# Patient Record
Sex: Female | Born: 1975 | Race: Black or African American | Hispanic: No | Marital: Married | Smoking: Never smoker
Health system: Southern US, Community
[De-identification: ages and names within clinical notes are randomized; demographics above are authoritative.]

---

## 2020-09-13 ENCOUNTER — Encounter (HOSPITAL_BASED_OUTPATIENT_CLINIC_OR_DEPARTMENT_OTHER): Payer: Self-pay | Admitting: *Deleted

## 2020-09-13 ENCOUNTER — Other Ambulatory Visit: Payer: Self-pay

## 2020-09-13 ENCOUNTER — Emergency Department (HOSPITAL_BASED_OUTPATIENT_CLINIC_OR_DEPARTMENT_OTHER)
Admission: EM | Admit: 2020-09-13 | Discharge: 2020-09-14 | Disposition: A | Discharge status: Home / Self Care | Payer: Worker's Compensation | Attending: Emergency Medicine | Admitting: Emergency Medicine

## 2020-09-13 ENCOUNTER — Emergency Department (HOSPITAL_BASED_OUTPATIENT_CLINIC_OR_DEPARTMENT_OTHER): Payer: Worker's Compensation

## 2020-09-13 DIAGNOSIS — Y9269 Other specified industrial and construction area as the place of occurrence of the external cause: Secondary | ICD-10-CM | POA: Insufficient documentation

## 2020-09-13 DIAGNOSIS — S161XXA Strain of muscle, fascia and tendon at neck level, initial encounter: Secondary | ICD-10-CM | POA: Diagnosis not present

## 2020-09-13 DIAGNOSIS — Y990 Civilian activity done for income or pay: Secondary | ICD-10-CM | POA: Insufficient documentation

## 2020-09-13 DIAGNOSIS — S0990XA Unspecified injury of head, initial encounter: Secondary | ICD-10-CM | POA: Insufficient documentation

## 2020-09-13 DIAGNOSIS — W208XXA Other cause of strike by thrown, projected or falling object, initial encounter: Secondary | ICD-10-CM | POA: Insufficient documentation

## 2020-09-13 DIAGNOSIS — S199XXA Unspecified injury of neck, initial encounter: Secondary | ICD-10-CM | POA: Diagnosis present

## 2020-09-13 NOTE — ED Provider Notes (Signed)
[facility] EMERGENCY DEPARTMENT Provider Note   CSN: [id number] Arrival date & time: 09/13/20  1700     History No chief complaint on file.   Tamara Gordon is a 44 y.o. female.  Patient sustained an injury at work. The injury occurred at 6:45 in the morning. Patient was struck by a heavy metal object on top of her head. Complaint of pain to the top of the head and neck pain. No loss of consciousness. No other injuries.        History reviewed. No pertinent past medical history.  There are no problems to display for this patient.   History reviewed. No pertinent surgical history.   OB History   No obstetric history on file.     No family history on file.  Social History   Tobacco Use   Smoking status: Never Smoker   Smokeless tobacco: Never Used  Substance Use Topics   Alcohol use: Never   Drug use: Never    Home Medications Prior to Admission medications   Not on File    Allergies    Patient has no known allergies.  Review of Systems   Review of Systems  Constitutional: Negative for chills and fever.  HENT: Negative for congestion, rhinorrhea and sore throat.   Eyes: Negative for visual disturbance.  Respiratory: Negative for cough and shortness of breath.   Cardiovascular: Negative for chest pain and leg swelling.  Gastrointestinal: Negative for abdominal pain, diarrhea, nausea and vomiting.  Genitourinary: Negative for dysuria.  Musculoskeletal: Positive for neck pain. Negative for back pain.  Skin: Negative for rash.  Neurological: Positive for headaches. Negative for dizziness and light-headedness.  Hematological: Does not bruise/bleed easily.  Psychiatric/Behavioral: Negative for confusion.    Physical Exam Updated Vital Signs BP 114/72    Pulse 60    Temp 98.5 F (36.9 C) (Oral)    Resp 18    Ht 1.575 m (5\' 2" )    Wt 89.4 kg    SpO2 100%    BMI 36.03 kg/m   Physical Exam Vitals and nursing note reviewed.  Constitutional:       General: She is not in acute distress.    Appearance: Normal appearance. She is well-developed.  HENT:     Head: Normocephalic and atraumatic.  Eyes:     Extraocular Movements: Extraocular movements intact.     Conjunctiva/sclera: Conjunctivae normal.     Pupils: Pupils are equal, round, and reactive to light.  Neck:     Comments: Mild tenderness to palpation to the posterior part of the cervical spine. Cardiovascular:     Rate and Rhythm: Normal rate and regular rhythm.     Heart sounds: No murmur heard.   Pulmonary:     Effort: Pulmonary effort is normal. No respiratory distress.     Breath sounds: Normal breath sounds.  Abdominal:     Palpations: Abdomen is soft.     Tenderness: There is no abdominal tenderness.  Musculoskeletal:        General: No tenderness, deformity or signs of injury. Normal range of motion.     Cervical back: Normal range of motion and neck supple.  Skin:    General: Skin is warm and dry.     Capillary Refill: Capillary refill takes less than 2 seconds.  Neurological:     General: No focal deficit present.     Mental Status: She is alert and oriented to person, place, and time.     Cranial  Nerves: No cranial nerve deficit.     Sensory: No sensory deficit.     Motor: No weakness.     ED Results / Procedures / Treatments   Labs (all labs ordered are listed, but only abnormal results are displayed) Labs Reviewed - No data to display  EKG None  Radiology CT Head Wo Contrast  Result Date: 09/13/2020 CLINICAL DATA:  Head trauma, mod-severe. Piece of metal fell and hit top of head at work. Headache. No loss of consciousness. EXAM: CT HEAD WITHOUT CONTRAST TECHNIQUE: Contiguous axial images were obtained from the base of the skull through the vertex without intravenous contrast. COMPARISON:  None. FINDINGS: Brain: No intracranial hemorrhage, mass effect, or midline shift. No hydrocephalus. The basilar cisterns are patent. No evidence of  territorial infarct or acute ischemia. No extra-axial or intracranial fluid collection. Vascular: No hyperdense vessel or unexpected calcification. Skull: Normal. Negative for fracture or focal lesion. Sinuses/Orbits: Paranasal sinuses and mastoid air cells are clear. The visualized orbits are unremarkable. Other: None. IMPRESSION: Negative noncontrast head CT. Electronically Signed   By: Narda Rutherford M.D.   On: 09/13/2020 22:56   CT Cervical Spine Wo Contrast  Result Date: 09/13/2020 CLINICAL DATA:  Polytrauma, critical, head/C-spine injury suspected. EXAM: CT CERVICAL SPINE WITHOUT CONTRAST TECHNIQUE: Multidetector CT imaging of the cervical spine was performed without intravenous contrast. Multiplanar CT image reconstructions were also generated. COMPARISON:  None. FINDINGS: Alignment: Straightening of normal lordosis. No traumatic subluxation. Skull base and vertebrae: No acute fracture. Vertebral body heights are maintained. The dens and skull base are intact. Non fusion posterior arch of C1. Soft tissues and spinal canal: No prevertebral fluid or swelling. No visible canal hematoma. Disc levels:  Disc spaces are preserved. Upper chest: Negative. Other: None. IMPRESSION: Straightening of normal lordosis typically seen with positioning or muscle spasm. No acute fracture or traumatic subluxation of the cervical spine. Electronically Signed   By: Narda Rutherford M.D.   On: 09/13/2020 22:58    Procedures Procedures (including critical care time)  Medications Ordered in ED Medications - No data to display  ED Course  I have reviewed the triage vital signs and the nursing notes.  Pertinent labs & imaging results that were available during my care of the patient were reviewed by me and considered in my medical decision making (see chart for details).    MDM Rules/Calculators/A&P                          CT head and neck without any acute findings. Patient stable and cleared for discharge  home. We'll have her follow-up with sports medicine. Work note provided to be out of work for 7 days. Have not completely ruled out a concussive type syndrome. Also could have some long-term neck pain. Patient understands the importance of following up. Recommend nonsteroidal to help with the pain.    Final Clinical Impression(s) / ED Diagnoses Final diagnoses:  Injury of head, initial encounter  Cervical strain, acute, initial encounter    Rx / DC Orders ED Discharge Orders    None       Vanetta Mulders, MD 09/13/20 2352

## 2020-09-13 NOTE — Discharge Instructions (Signed)
Work note provided to be out of work for 7 days. Make an appointment to follow-up with sports medicine. CT head and neck without any acute findings. But this does not rule out concussion. In addition the cervical strain pain can last for a period of time. Recommend taking Motrin, Advil, Aleve, or Naprosyn. For the pain. Return for any new or worse symptoms.

## 2020-09-13 NOTE — ED Triage Notes (Signed)
While at work this am a piece of metal fell and hit her in the top of the head. No LOC. Headache.

## 2020-09-29 ENCOUNTER — Telehealth: Payer: Self-pay | Admitting: Family Medicine

## 2020-09-29 NOTE — Telephone Encounter (Signed)
Called pt to offer ED follow up appt w/ Dr.Schmitz--left msg.  -glh

## 2021-07-07 IMAGING — CT CT CERVICAL SPINE W/O CM
3 of 4 series · 12 of 33 positions shown, 14 images · non-contrast
Comparison: None.

CLINICAL DATA: Polytrauma, critical, head/C-spine injury suspected.

EXAM:
CT CERVICAL SPINE WITHOUT CONTRAST
TECHNIQUE: Multidetector CT imaging of the cervical spine was performed without
intravenous contrast. Multiplanar CT image reconstructions were also
generated.

[Series 5: coronals · coronal · 0.23mm/px · 3 of 61 slices shown]
[im 15/61  bone]
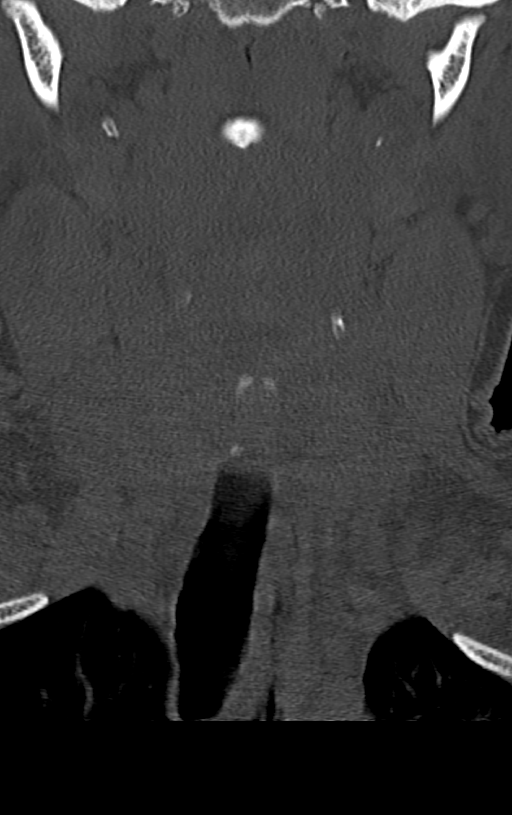
[im 25/61  bone]
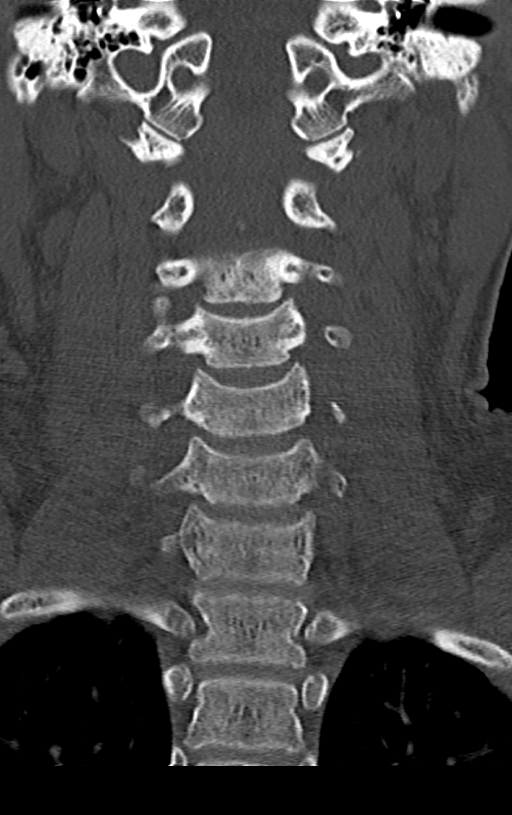
[im 36/61  bone]
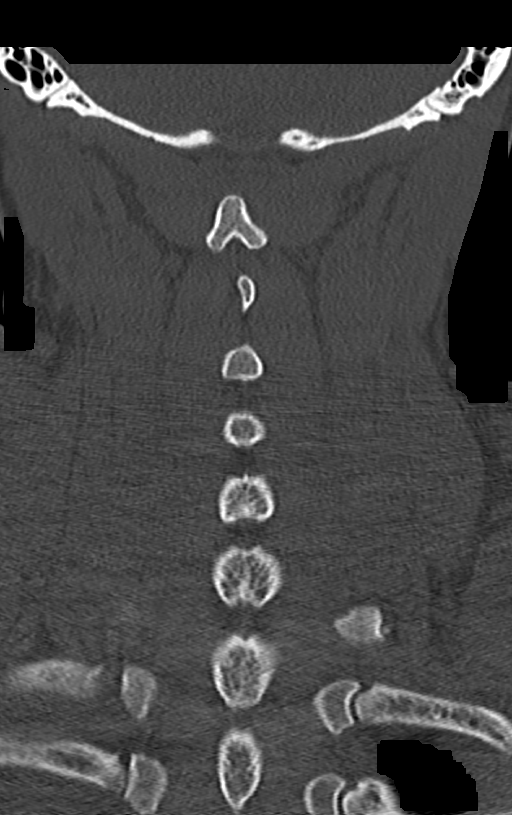

[Series 6: sagittals · sagittal · 0.24mm/px · 5 of 61 slices shown, 6 images]
[im 21/61  bone]
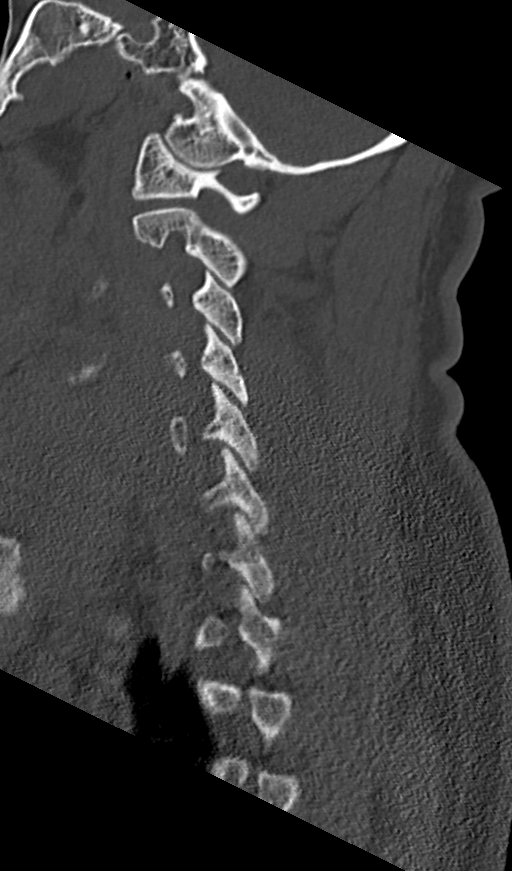
[im 26/61  bone]
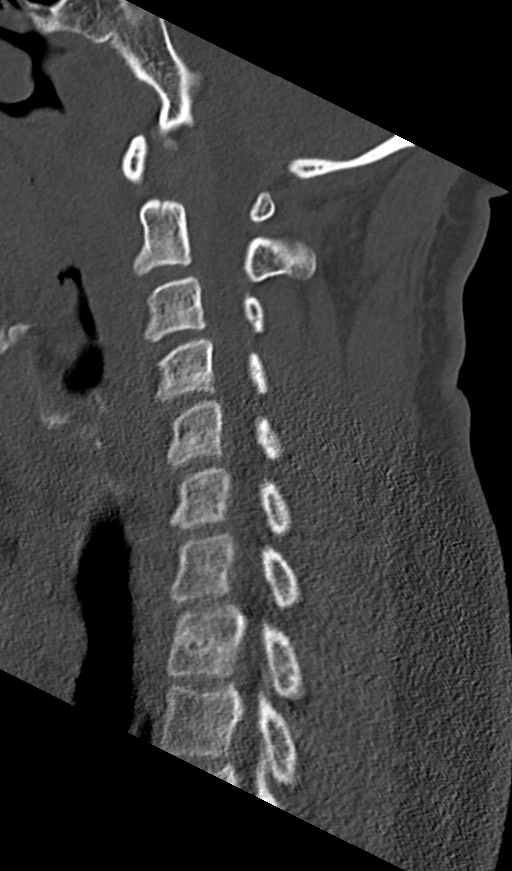
[im 31/61  soft-tissue]
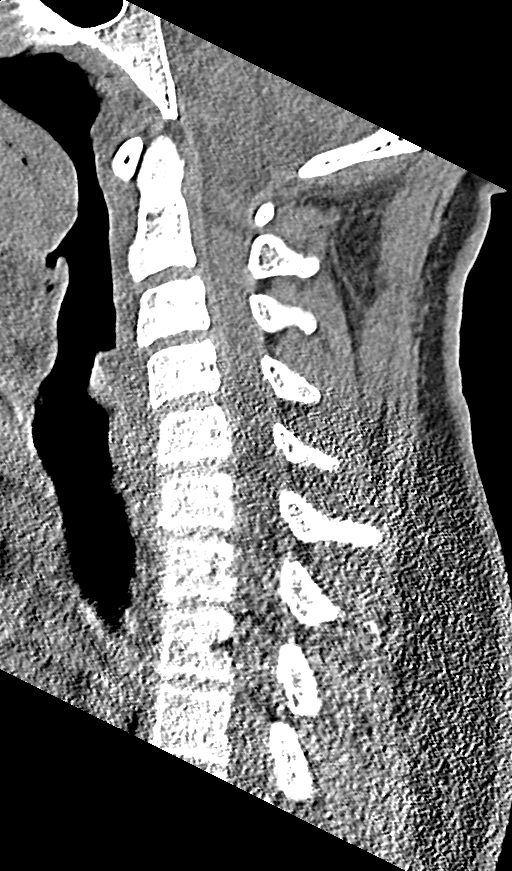
[im 31/61  bone]
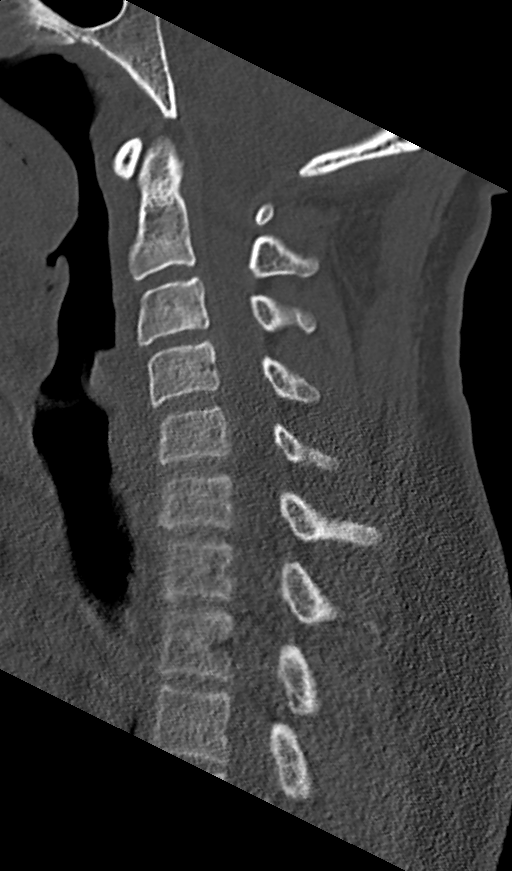
[im 36/61  bone]
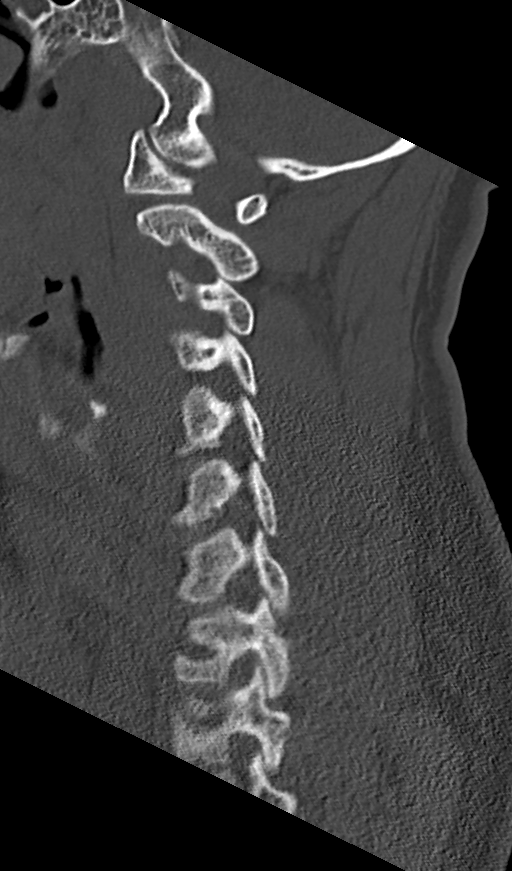
[im 41/61  bone]
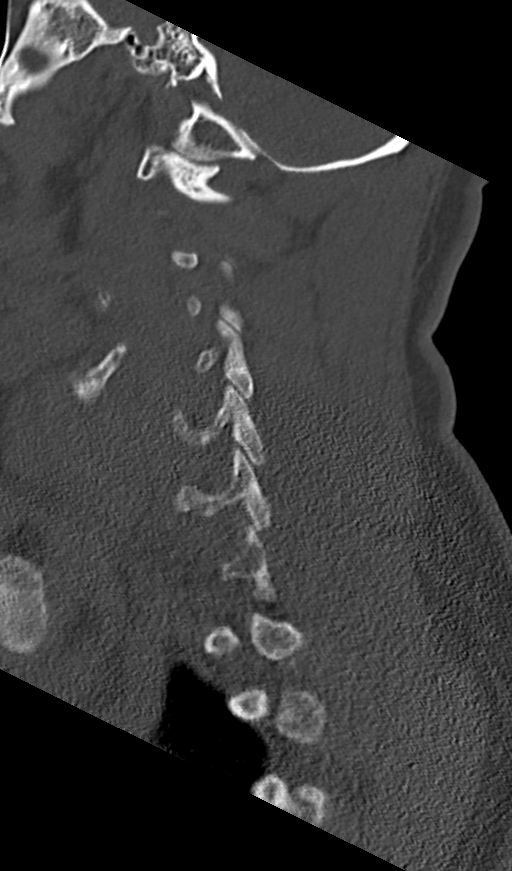

[Series 7: orthogonals · axial · 0.23mm/px · z∈[+746,+867]mm · 4 of 94 slices shown, 5 images]
[im 14/94  soft-tissue]
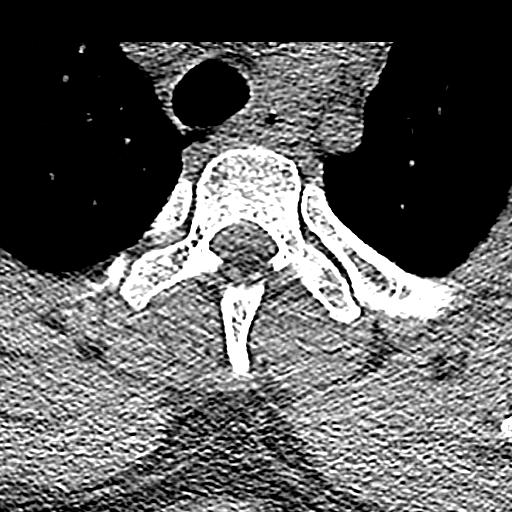
[im 14/94  bone]
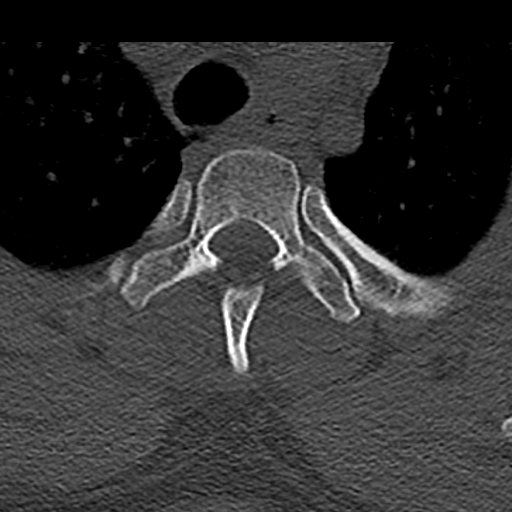
[im 40/94  bone]
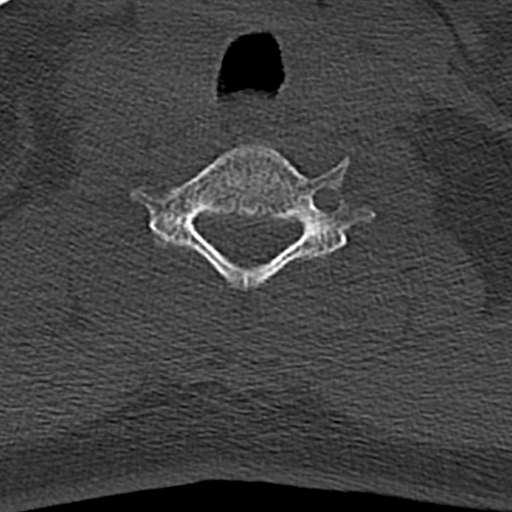
[im 54/94  bone]
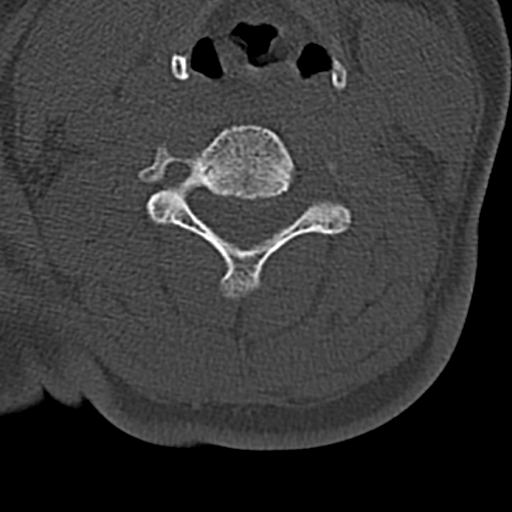
[im 80/94  bone]
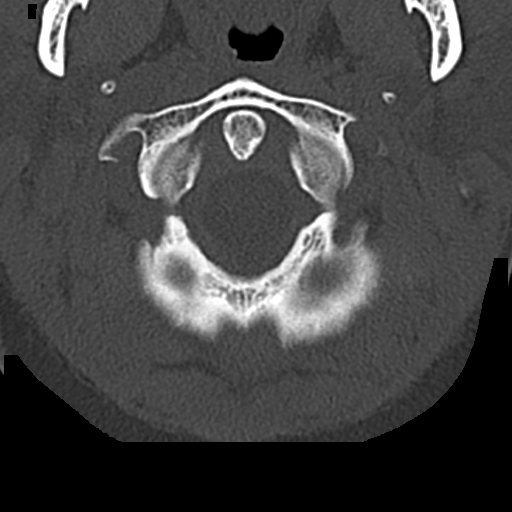

[12 of 33 positions shown; findings below may reference images not displayed]

FINDINGS: Alignment: Straightening of normal lordosis. No traumatic
subluxation.

Skull base and vertebrae: No acute fracture. Vertebral body heights
are maintained. The dens and skull base are intact. Non fusion
posterior arch of C1.

Soft tissues and spinal canal: No prevertebral fluid or swelling. No
visible canal hematoma.

Disc levels:  Disc spaces are preserved.

Upper chest: Negative.

Other: None.
IMPRESSION: Straightening of normal lordosis typically seen with positioning or
muscle spasm. No acute fracture or traumatic subluxation of the
cervical spine.

## 2023-03-28 ENCOUNTER — Encounter: Payer: Self-pay | Admitting: *Deleted
